# Patient Record
Sex: Male | Born: 1975 | Race: Black or African American | Hispanic: No | Marital: Single | Smoking: Never smoker
Health system: Southern US, Community
[De-identification: ages and names within clinical notes are randomized; demographics above are authoritative.]

## PROBLEM LIST (undated history)

## (undated) DIAGNOSIS — S060X9A Concussion with loss of consciousness of unspecified duration, initial encounter: Secondary | ICD-10-CM

## (undated) DIAGNOSIS — S060XAA Concussion with loss of consciousness status unknown, initial encounter: Secondary | ICD-10-CM

---

## 2016-09-08 ENCOUNTER — Emergency Department (HOSPITAL_COMMUNITY): Payer: Self-pay

## 2016-09-08 ENCOUNTER — Encounter (HOSPITAL_COMMUNITY): Payer: Self-pay

## 2016-09-08 ENCOUNTER — Emergency Department (HOSPITAL_COMMUNITY)
Admission: EM | Admit: 2016-09-08 | Discharge: 2016-09-08 | Disposition: A | Discharge status: Home / Self Care | Payer: Self-pay | Attending: Emergency Medicine | Admitting: Emergency Medicine

## 2016-09-08 DIAGNOSIS — M20012 Mallet finger of left finger(s): Secondary | ICD-10-CM | POA: Insufficient documentation

## 2016-09-08 DIAGNOSIS — R1011 Right upper quadrant pain: Secondary | ICD-10-CM | POA: Insufficient documentation

## 2016-09-08 DIAGNOSIS — R0789 Other chest pain: Secondary | ICD-10-CM | POA: Insufficient documentation

## 2016-09-08 HISTORY — DX: Concussion with loss of consciousness status unknown, initial encounter: S06.0XAA

## 2016-09-08 HISTORY — DX: Concussion with loss of consciousness of unspecified duration, initial encounter: S06.0X9A

## 2016-09-08 LAB — CBC
HCT: 41.7 % (ref 39.0–52.0)
HEMOGLOBIN: 14.1 g/dL (ref 13.0–17.0)
MCH: 29.5 pg (ref 26.0–34.0)
MCHC: 33.8 g/dL (ref 30.0–36.0)
MCV: 87.2 fL (ref 78.0–100.0)
PLATELETS: 187 10*3/uL (ref 150–400)
RBC: 4.78 MIL/uL (ref 4.22–5.81)
RDW: 13.3 % (ref 11.5–15.5)
WBC: 5.7 10*3/uL (ref 4.0–10.5)

## 2016-09-08 LAB — URINALYSIS, ROUTINE W REFLEX MICROSCOPIC
BACTERIA UA: NONE SEEN
BILIRUBIN URINE: NEGATIVE
GLUCOSE, UA: NEGATIVE mg/dL
Hgb urine dipstick: NEGATIVE
KETONES UR: NEGATIVE mg/dL
NITRITE: NEGATIVE
PH: 5 (ref 5.0–8.0)
Protein, ur: 30 mg/dL — AB
SPECIFIC GRAVITY, URINE: 1.026 (ref 1.005–1.030)

## 2016-09-08 LAB — COMPREHENSIVE METABOLIC PANEL
ALBUMIN: 3.8 g/dL (ref 3.5–5.0)
ALK PHOS: 62 U/L (ref 38–126)
ALT: 18 U/L (ref 17–63)
ANION GAP: 15 (ref 5–15)
AST: 31 U/L (ref 15–41)
BUN: 14 mg/dL (ref 6–20)
CHLORIDE: 106 mmol/L (ref 101–111)
CO2: 17 mmol/L — AB (ref 22–32)
Calcium: 9.2 mg/dL (ref 8.9–10.3)
Creatinine, Ser: 1.19 mg/dL (ref 0.61–1.24)
GFR calc non Af Amer: >60 mL/min (ref >60)
GLUCOSE: 103 mg/dL — AB (ref 65–99)
POTASSIUM: 3.5 mmol/L (ref 3.5–5.1)
Sodium: 138 mmol/L (ref 135–145)
Total Bilirubin: 0.6 mg/dL (ref 0.3–1.2)
Total Protein: 7.4 g/dL (ref 6.5–8.1)

## 2016-09-08 LAB — LIPASE, BLOOD: Lipase: 22 U/L (ref 11–51)

## 2016-09-08 MED ORDER — IBUPROFEN 400 MG PO TABS
600.0000 mg | ORAL_TABLET | Freq: Once | ORAL | Status: AC
  Administered 2016-09-08: 600 mg via ORAL
  Filled 2016-09-08: qty 1

## 2016-09-08 MED ORDER — ACETAMINOPHEN 325 MG PO TABS
650.0000 mg | ORAL_TABLET | Freq: Once | ORAL | Status: AC
  Administered 2016-09-08: 650 mg via ORAL
  Filled 2016-09-08: qty 2

## 2016-09-08 NOTE — Progress Notes (Signed)
Orthopedic Tech Progress Note Patient Details:  Geoffrey LassoDominic Hernandez 05/04/1975 [id number]  Ortho Devices Type of Ortho Device: Finger splint Ortho Device/Splint Location: applied finger splint to pt left pinky finger.  pt tolerated well. Left hand/ pinky finger Ortho Device/Splint Interventions: Application, Adjustment   Alvina ChouWilliams, Jordyne Poehlman C 09/08/2016, 9:49 PM
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
## 2016-09-08 NOTE — ED Notes (Signed)
Gave Pt water per ok Dr. Hyacinth MeekerMiller
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
## 2018-10-20 IMAGING — CR DG HAND COMPLETE 3+V*L*
3 series · 3 of 3 positions shown · non-contrast
Comparison: None.

CLINICAL DATA: Mallet finger deformity of the left fifth digit.

EXAM:
LEFT HAND - COMPLETE 3+ VIEW

[hand pa]
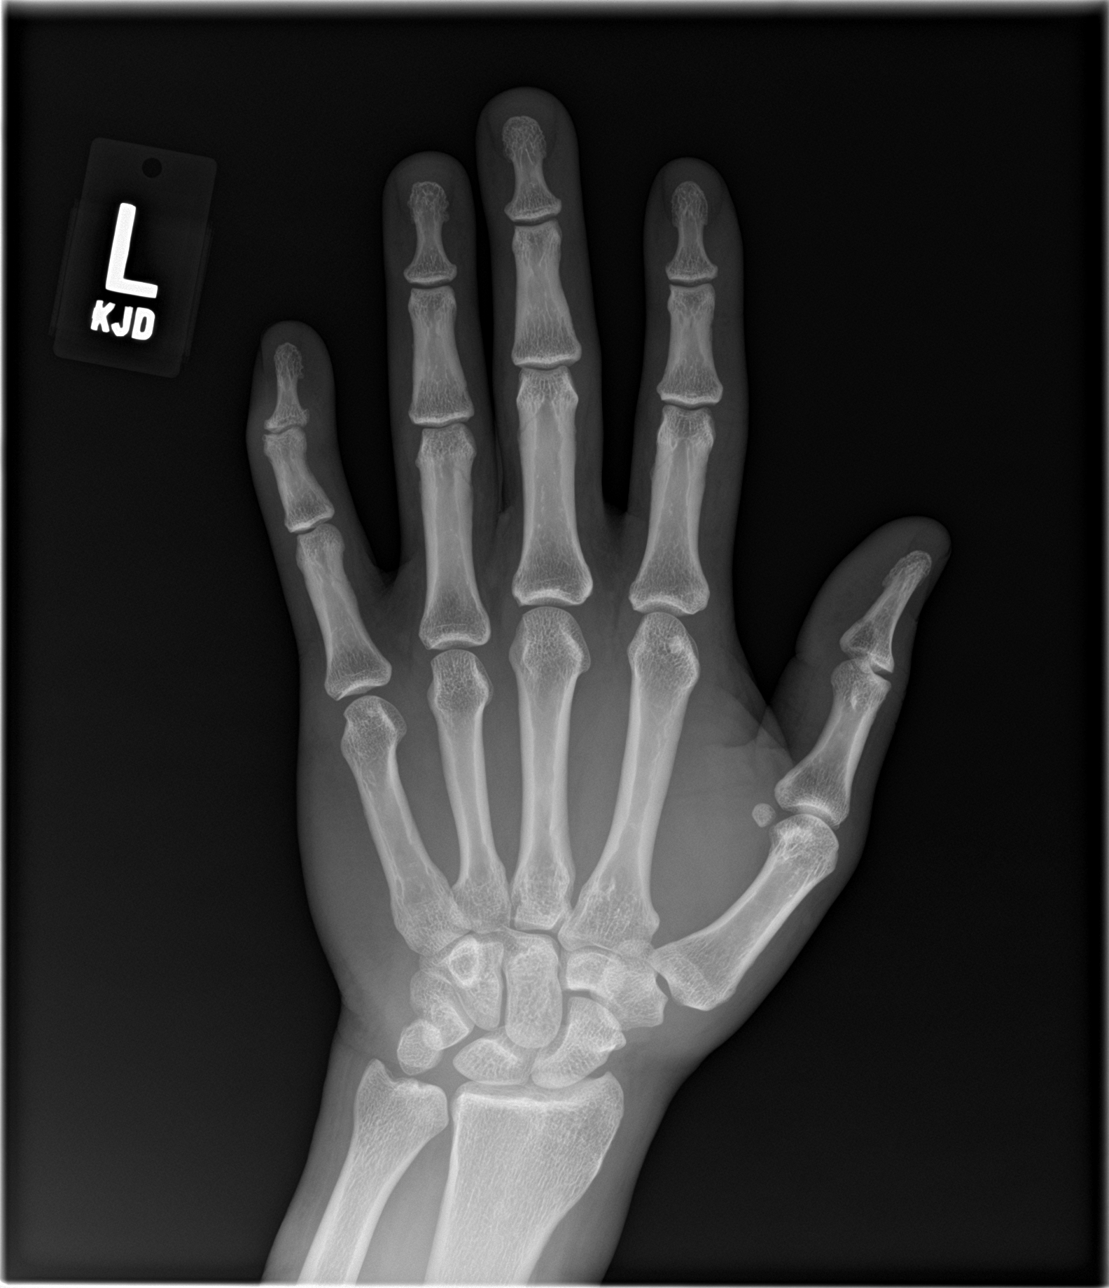

[hand obl]
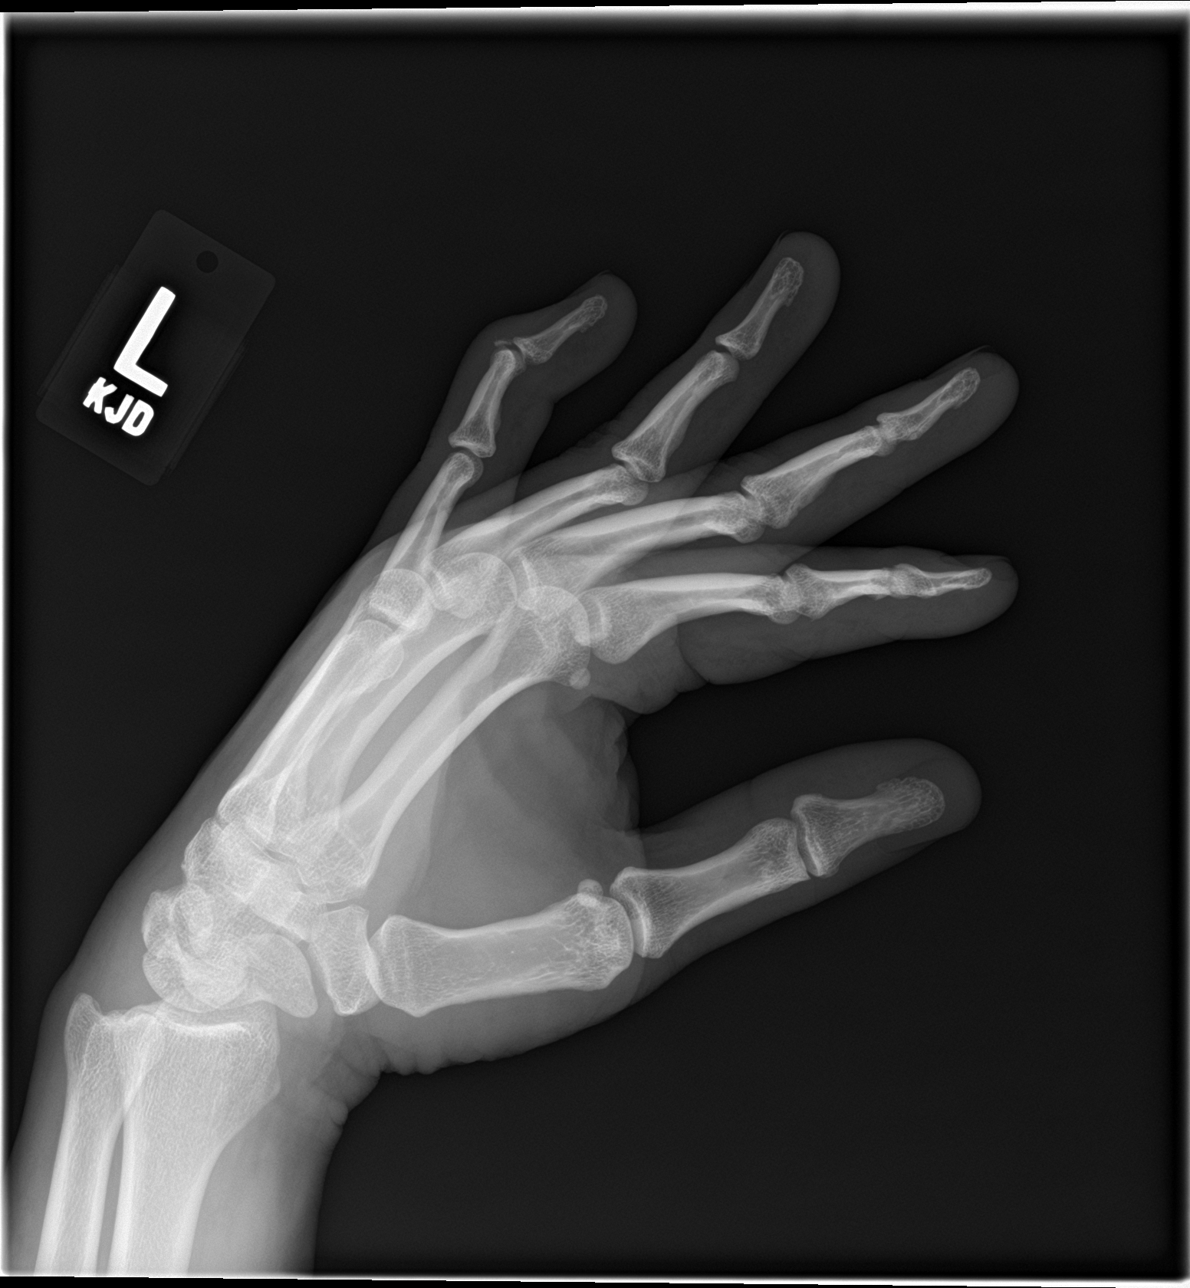

[hand lat]
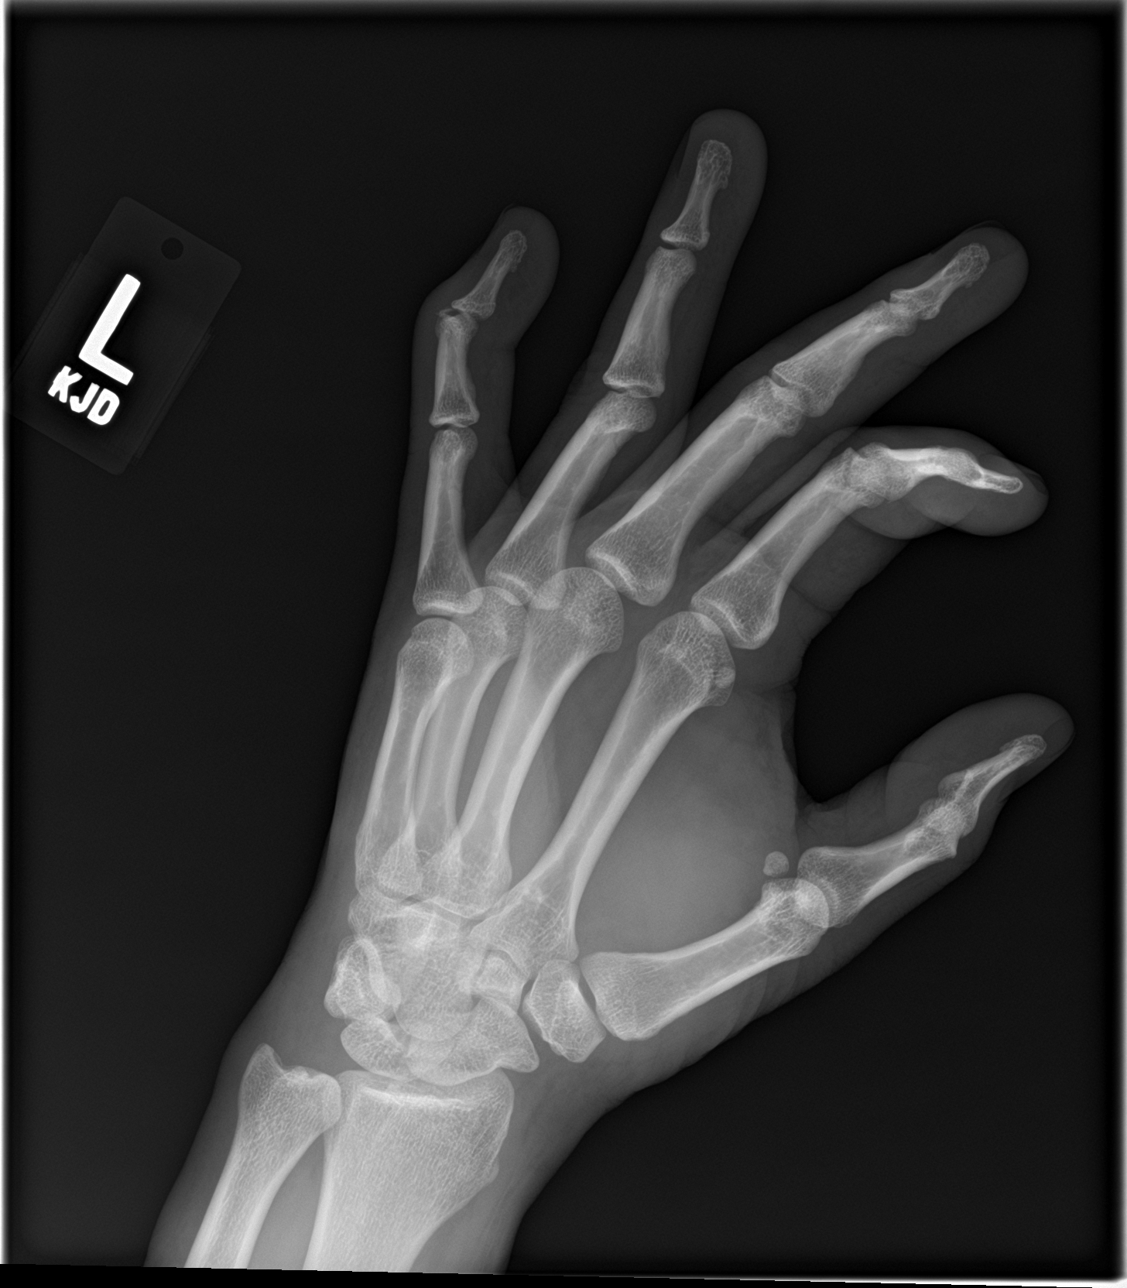

[3 of 3 positions shown; findings below may reference images not displayed]

FINDINGS: The left fifth digit is held in flexion at the DIP joint. There is a
small ossific density overlying the dorsum of the DIP joint
consistent with an avulsion fracture likely involving the base of
the left fifth distal phalanx of the donor site is not clearly
identified. Given the persistent flexion at the DIP joint an
avulsion injury involving the extensor terminal tendon of the left
fifth finger is suspected. Remainder of the study is unremarkable.
IMPRESSION: Mallet finger deformity of the left fifth digit with small ossific
density over the dorsum of the DIP joint consistent with a terminal
extensor tendon avulsion of the fifth digit.
